# Patient Record
Sex: Male | Born: 1935 | Race: White | Hispanic: No | Marital: Married
Health system: Southern US, Community
[De-identification: ages and names within clinical notes are randomized; demographics above are authoritative.]

---

## 1998-10-09 ENCOUNTER — Encounter: Payer: Self-pay | Admitting: Emergency Medicine

## 1998-10-09 ENCOUNTER — Emergency Department (HOSPITAL_COMMUNITY): Admission: EM | Admit: 1998-10-09 | Discharge: 1998-10-09 | Payer: Self-pay | Admitting: Emergency Medicine

## 1999-05-03 ENCOUNTER — Ambulatory Visit (HOSPITAL_COMMUNITY): Admission: RE | Admit: 1999-05-03 | Discharge: 1999-05-03 | Payer: Self-pay | Admitting: Urology

## 2002-06-10 ENCOUNTER — Encounter: Admission: RE | Admit: 2002-06-10 | Discharge: 2002-06-10 | Payer: Self-pay | Admitting: Family Medicine

## 2002-06-10 ENCOUNTER — Encounter: Payer: Self-pay | Admitting: Family Medicine

## 2002-06-12 ENCOUNTER — Encounter: Admission: RE | Admit: 2002-06-12 | Discharge: 2002-09-10 | Payer: Self-pay | Admitting: Family Medicine

## 2004-11-09 ENCOUNTER — Ambulatory Visit: Payer: Self-pay | Admitting: Family Medicine

## 2005-03-08 ENCOUNTER — Ambulatory Visit: Payer: Self-pay | Admitting: Family Medicine

## 2005-03-14 ENCOUNTER — Ambulatory Visit: Payer: Self-pay | Admitting: Family Medicine

## 2005-04-25 ENCOUNTER — Ambulatory Visit: Payer: Self-pay | Admitting: Internal Medicine

## 2005-07-06 ENCOUNTER — Ambulatory Visit: Payer: Self-pay | Admitting: Internal Medicine

## 2005-10-10 ENCOUNTER — Ambulatory Visit: Payer: Self-pay | Admitting: Internal Medicine

## 2006-06-27 ENCOUNTER — Ambulatory Visit: Payer: Self-pay | Admitting: Internal Medicine

## 2006-07-06 ENCOUNTER — Emergency Department (HOSPITAL_COMMUNITY): Admission: EM | Admit: 2006-07-06 | Discharge: 2006-07-06 | Payer: Self-pay | Admitting: Emergency Medicine

## 2006-07-09 ENCOUNTER — Ambulatory Visit: Payer: Self-pay | Admitting: Internal Medicine

## 2006-10-26 ENCOUNTER — Encounter: Payer: Self-pay | Admitting: *Deleted

## 2006-10-26 DIAGNOSIS — M65849 Other synovitis and tenosynovitis, unspecified hand: Secondary | ICD-10-CM

## 2006-10-26 DIAGNOSIS — Z9889 Other specified postprocedural states: Secondary | ICD-10-CM

## 2006-10-26 DIAGNOSIS — F528 Other sexual dysfunction not due to a substance or known physiological condition: Secondary | ICD-10-CM

## 2006-10-26 DIAGNOSIS — Z8679 Personal history of other diseases of the circulatory system: Secondary | ICD-10-CM | POA: Insufficient documentation

## 2006-10-26 DIAGNOSIS — I10 Essential (primary) hypertension: Secondary | ICD-10-CM | POA: Insufficient documentation

## 2006-10-26 DIAGNOSIS — M65839 Other synovitis and tenosynovitis, unspecified forearm: Secondary | ICD-10-CM

## 2006-10-26 DIAGNOSIS — E785 Hyperlipidemia, unspecified: Secondary | ICD-10-CM | POA: Insufficient documentation

## 2006-10-26 DIAGNOSIS — F068 Other specified mental disorders due to known physiological condition: Secondary | ICD-10-CM

## 2007-06-24 ENCOUNTER — Ambulatory Visit: Payer: Self-pay | Admitting: Internal Medicine

## 2007-07-29 ENCOUNTER — Telehealth (INDEPENDENT_AMBULATORY_CARE_PROVIDER_SITE_OTHER): Payer: Self-pay | Admitting: *Deleted

## 2007-08-08 ENCOUNTER — Ambulatory Visit: Payer: Self-pay | Admitting: Internal Medicine

## 2007-08-08 DIAGNOSIS — R609 Edema, unspecified: Secondary | ICD-10-CM

## 2007-09-09 ENCOUNTER — Ambulatory Visit: Payer: Self-pay | Admitting: Internal Medicine

## 2007-09-09 DIAGNOSIS — M545 Low back pain: Secondary | ICD-10-CM | POA: Insufficient documentation

## 2007-12-09 ENCOUNTER — Telehealth (INDEPENDENT_AMBULATORY_CARE_PROVIDER_SITE_OTHER): Payer: Self-pay | Admitting: *Deleted

## 2007-12-09 DIAGNOSIS — M79609 Pain in unspecified limb: Secondary | ICD-10-CM

## 2007-12-22 ENCOUNTER — Encounter: Payer: Self-pay | Admitting: Internal Medicine

## 2008-07-07 ENCOUNTER — Encounter: Admission: RE | Admit: 2008-07-07 | Discharge: 2008-07-07 | Payer: Self-pay | Admitting: Geriatric Medicine

## 2009-01-23 ENCOUNTER — Inpatient Hospital Stay (HOSPITAL_COMMUNITY): Admission: EM | Admit: 2009-01-23 | Discharge: 2009-01-24 | Payer: Self-pay | Admitting: Emergency Medicine

## 2009-03-05 ENCOUNTER — Inpatient Hospital Stay (HOSPITAL_COMMUNITY): Admission: EM | Admit: 2009-03-05 | Discharge: 2009-03-08 | Payer: Self-pay | Admitting: Emergency Medicine

## 2009-03-29 DEATH — deceased

## 2010-04-19 LAB — URINE MICROSCOPIC-ADD ON

## 2010-04-19 LAB — CBC
HCT: 27.2 % — ABNORMAL LOW (ref 39.0–52.0)
HCT: 33.8 % — ABNORMAL LOW (ref 39.0–52.0)
Hemoglobin: 10 g/dL — ABNORMAL LOW (ref 13.0–17.0)
Hemoglobin: 9.2 g/dL — ABNORMAL LOW (ref 13.0–17.0)
MCHC: 33.8 g/dL (ref 30.0–36.0)
MCV: 89.8 fL (ref 78.0–100.0)
Platelets: 156 10*3/uL (ref 150–400)
Platelets: 205 10*3/uL (ref 150–400)
RBC: 3.32 MIL/uL — ABNORMAL LOW (ref 4.22–5.81)
RDW: 15.1 % (ref 11.5–15.5)
WBC: 31.8 10*3/uL — ABNORMAL HIGH (ref 4.0–10.5)
WBC: 33.2 10*3/uL — ABNORMAL HIGH (ref 4.0–10.5)
WBC: 34.9 10*3/uL — ABNORMAL HIGH (ref 4.0–10.5)

## 2010-04-19 LAB — POCT I-STAT 3, ART BLOOD GAS (G3+)
Acid-Base Excess: 1 mmol/L (ref 0.0–2.0)
Bicarbonate: 25.9 mEq/L — ABNORMAL HIGH (ref 20.0–24.0)
TCO2: 27 mmol/L (ref 0–100)
pO2, Arterial: 189 mmHg — ABNORMAL HIGH (ref 80.0–100.0)

## 2010-04-19 LAB — COMPREHENSIVE METABOLIC PANEL
ALT: 34 U/L (ref 0–53)
AST: 40 U/L — ABNORMAL HIGH (ref 0–37)
Albumin: 1.7 g/dL — ABNORMAL LOW (ref 3.5–5.2)
Albumin: 2.3 g/dL — ABNORMAL LOW (ref 3.5–5.2)
Alkaline Phosphatase: 65 U/L (ref 39–117)
Alkaline Phosphatase: 68 U/L (ref 39–117)
BUN: 31 mg/dL — ABNORMAL HIGH (ref 6–23)
BUN: 46 mg/dL — ABNORMAL HIGH (ref 6–23)
Chloride: 116 mEq/L — ABNORMAL HIGH (ref 96–112)
Chloride: 126 mEq/L — ABNORMAL HIGH (ref 96–112)
Creatinine, Ser: 1.31 mg/dL (ref 0.4–1.5)
GFR calc Af Amer: 47 mL/min — ABNORMAL LOW (ref >60)
Glucose, Bld: 77 mg/dL (ref 70–99)
Potassium: 3.8 mEq/L (ref 3.5–5.1)
Potassium: 3.9 mEq/L (ref 3.5–5.1)
Sodium: 152 mEq/L — ABNORMAL HIGH (ref 135–145)
Total Bilirubin: 0.7 mg/dL (ref 0.3–1.2)
Total Bilirubin: 0.7 mg/dL (ref 0.3–1.2)
Total Protein: 7.7 g/dL (ref 6.0–8.3)

## 2010-04-19 LAB — BASIC METABOLIC PANEL
Calcium: 7.5 mg/dL — ABNORMAL LOW (ref 8.4–10.5)
Creatinine, Ser: 1.26 mg/dL (ref 0.4–1.5)
GFR calc Af Amer: >60 mL/min (ref >60)
GFR calc non Af Amer: 54 mL/min — ABNORMAL LOW (ref >60)
Sodium: 149 mEq/L — ABNORMAL HIGH (ref 135–145)

## 2010-04-19 LAB — CULTURE, BLOOD (ROUTINE X 2): Culture: NO GROWTH

## 2010-04-19 LAB — URINE CULTURE: Colony Count: NO GROWTH

## 2010-04-19 LAB — DIFFERENTIAL
Basophils Absolute: 0 10*3/uL (ref 0.0–0.1)
Basophils Relative: 0 % (ref 0–1)
Eosinophils Absolute: 0 10*3/uL (ref 0.0–0.7)
Eosinophils Absolute: 0 10*3/uL (ref 0.0–0.7)
Eosinophils Relative: 0 % (ref 0–5)
Lymphocytes Relative: 5 % — ABNORMAL LOW (ref 12–46)
Monocytes Absolute: 5.3 10*3/uL — ABNORMAL HIGH (ref 0.1–1.0)
Monocytes Absolute: 6 10*3/uL — ABNORMAL HIGH (ref 0.1–1.0)
Neutro Abs: 23.9 10*3/uL — ABNORMAL HIGH (ref 1.7–7.7)
Neutrophils Relative %: 79 % — ABNORMAL HIGH (ref 43–77)

## 2010-04-19 LAB — URINALYSIS, ROUTINE W REFLEX MICROSCOPIC
Glucose, UA: NEGATIVE mg/dL
Ketones, ur: NEGATIVE mg/dL
pH: 5 (ref 5.0–8.0)

## 2010-04-19 LAB — MRSA PCR SCREENING: MRSA by PCR: NEGATIVE

## 2010-05-01 LAB — URINE CULTURE: Special Requests: NEGATIVE

## 2010-05-01 LAB — URINE MICROSCOPIC-ADD ON

## 2010-05-01 LAB — BASIC METABOLIC PANEL
CO2: 26 mEq/L (ref 19–32)
CO2: 27 mEq/L (ref 19–32)
Calcium: 7.8 mg/dL — ABNORMAL LOW (ref 8.4–10.5)
Calcium: 8.7 mg/dL (ref 8.4–10.5)
Creatinine, Ser: 1.15 mg/dL (ref 0.4–1.5)
GFR calc Af Amer: >60 mL/min (ref >60)
GFR calc Af Amer: >60 mL/min (ref >60)
GFR calc non Af Amer: >60 mL/min (ref >60)
GFR calc non Af Amer: >60 mL/min (ref >60)
Glucose, Bld: 112 mg/dL — ABNORMAL HIGH (ref 70–99)
Sodium: 142 mEq/L (ref 135–145)

## 2010-05-01 LAB — CULTURE, BLOOD (ROUTINE X 2)

## 2010-05-01 LAB — GLUCOSE, CAPILLARY: Glucose-Capillary: 102 mg/dL — ABNORMAL HIGH (ref 70–99)

## 2010-05-01 LAB — LIPID PANEL
HDL: 15 mg/dL — ABNORMAL LOW (ref >39)
Triglycerides: 94 mg/dL (ref <150)

## 2010-05-01 LAB — CBC
Hemoglobin: 11.3 g/dL — ABNORMAL LOW (ref 13.0–17.0)
MCHC: 33.8 g/dL (ref 30.0–36.0)
RBC: 3.74 MIL/uL — ABNORMAL LOW (ref 4.22–5.81)
RBC: 4.21 MIL/uL — ABNORMAL LOW (ref 4.22–5.81)
RDW: 15.1 % (ref 11.5–15.5)
WBC: 9.5 10*3/uL (ref 4.0–10.5)

## 2010-05-01 LAB — URINALYSIS, ROUTINE W REFLEX MICROSCOPIC
Glucose, UA: NEGATIVE mg/dL
Protein, ur: NEGATIVE mg/dL
Specific Gravity, Urine: 1.023 (ref 1.005–1.030)
pH: 6 (ref 5.0–8.0)

## 2010-05-01 LAB — CARDIAC PANEL(CRET KIN+CKTOT+MB+TROPI)
Relative Index: INVALID (ref 0.0–2.5)
Total CK: 61 U/L (ref 7–232)

## 2010-05-01 LAB — DIFFERENTIAL
Basophils Absolute: 0 10*3/uL (ref 0.0–0.1)
Basophils Relative: 1 % (ref 0–1)
Eosinophils Absolute: 0 10*3/uL (ref 0.0–0.7)
Eosinophils Relative: 1 % (ref 0–5)
Lymphocytes Relative: 13 % (ref 12–46)
Lymphocytes Relative: 24 % (ref 12–46)
Monocytes Relative: 29 % — ABNORMAL HIGH (ref 3–12)
Monocytes Relative: 32 % — ABNORMAL HIGH (ref 3–12)
Neutro Abs: 4 10*3/uL (ref 1.7–7.7)
Neutrophils Relative %: 58 % (ref 43–77)

## 2010-05-01 LAB — PROTIME-INR
INR: 1.22 (ref 0.00–1.49)
Prothrombin Time: 15.3 seconds — ABNORMAL HIGH (ref 11.6–15.2)

## 2010-05-01 LAB — TSH: TSH: 1.894 u[IU]/mL (ref 0.350–4.500)

## 2010-05-01 LAB — POCT CARDIAC MARKERS: Myoglobin, poc: 199 ng/mL (ref 12–200)

## 2010-05-01 LAB — CK TOTAL AND CKMB (NOT AT ARMC)
Relative Index: INVALID (ref 0.0–2.5)
Total CK: 77 U/L (ref 7–232)

## 2010-06-13 NOTE — Assessment & Plan Note (Signed)
 [facility]                                 ON-CALL NOTE   NAME:[name], Vincent ESCAMILLA                      MRN:          [id number]  DATE:07/06/2006                            DOB:          November 10, 1921    Phone number is [phone].  Caller is [employer].  Regular doctor is Dr.  Debby Bud.  Tobi Bastos, the [facility] at [facility] took the initial message,  and I followed through, and I spoke to [place].  Patient states that  Meir has severe dizziness.  Yesterday he was somewhat dizzy, he fell,  he hurt his shoulder.  She does not think he bumped his head, but she is  not sure.  She gave him some of her vertigo pills, which were meclizine.  They did not help at all.  He denies numbness, headache, slurred speech,  or weakness on 1 side, but he is getting more and more dizzy, and she is  worried.  I told her to go ahead and take him to the emergency room for  evaluation.  He may or may not need an imaging study of the head to find  out what is going on.  This could possibly still be vertigo, but I would  expect it to improve a bit with the meclizine.  She is taking him to the  emergency room now.     Marne A. Tower, MD  Electronically Signed    MAT/MedQ  DD: 07/06/2006  DT: 07/06/2006  Job #: [id number]   cc:   Rosalyn Gess. Norins, MD

## 2010-11-16 LAB — DIFFERENTIAL
Basophils Absolute: 0
Basophils Relative: 0
Eosinophils Relative: 0
Lymphocytes Relative: 31
Lymphs Abs: 2.2
Neutro Abs: 3.2
Neutrophils Relative %: 44
Promyelocytes Absolute: 0
nRBC: 0

## 2010-11-16 LAB — I-STAT 8, (EC8 V) (CONVERTED LAB)
Acid-Base Excess: 1
Glucose, Bld: 91
HCT: 43
Hemoglobin: 14.6
Operator id: 291361
Potassium: 4.4
Sodium: 141
TCO2: 30

## 2010-11-16 LAB — URINALYSIS, ROUTINE W REFLEX MICROSCOPIC
Bilirubin Urine: NEGATIVE
Glucose, UA: NEGATIVE
Hgb urine dipstick: NEGATIVE
Ketones, ur: NEGATIVE
Protein, ur: 30 — AB
pH: 6

## 2010-11-16 LAB — POCT CARDIAC MARKERS: CKMB, poc: 1.1

## 2010-11-16 LAB — CBC
MCHC: 34.3
RBC: 4.38
WBC: 7.2

## 2011-10-26 ENCOUNTER — Other Ambulatory Visit: Payer: Self-pay | Admitting: *Deleted

## 2011-11-22 IMAGING — CT CT HEAD W/O CM
1 of 2 series · 15 of 30 positions shown, 19 images · non-contrast
Comparison: 07/06/2006.

CLINICAL DATA: Altered level of consciousness.  Fever.

CT HEAD WITHOUT CONTRAST
TECHNIQUE: Contiguous axial images were obtained from the base of
the skull through the vertex without contrast.

[Series 3: headseq 2.4 h60s · axial · 0.43mm/px · z∈[+1178,+1306]mm · 15 of 60 slices shown, 19 images]
[im 4/60  brain]
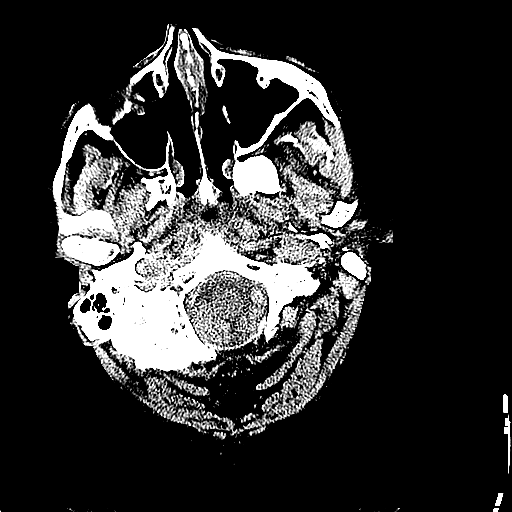
[im 4/60  bone]
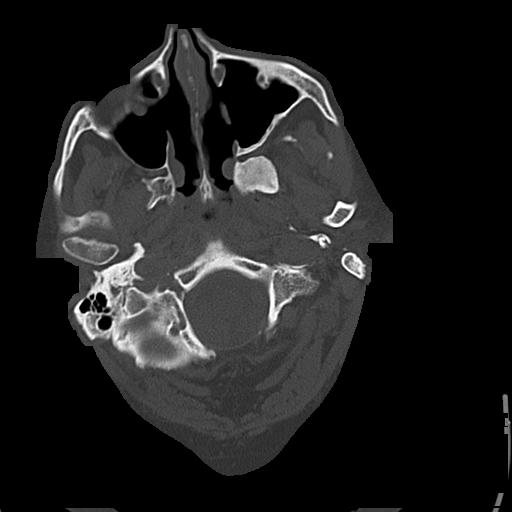
[im 7/60  brain]
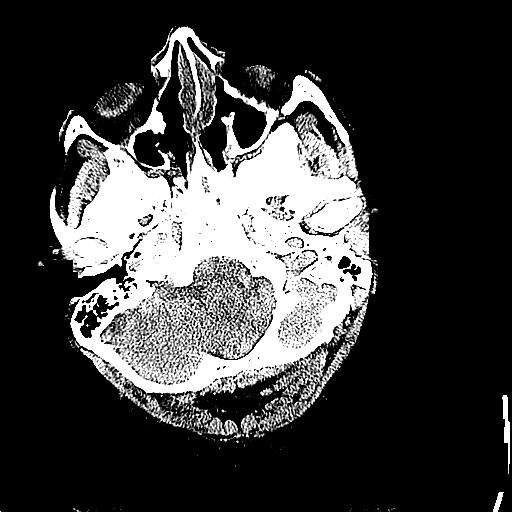
[im 13/60  brain]
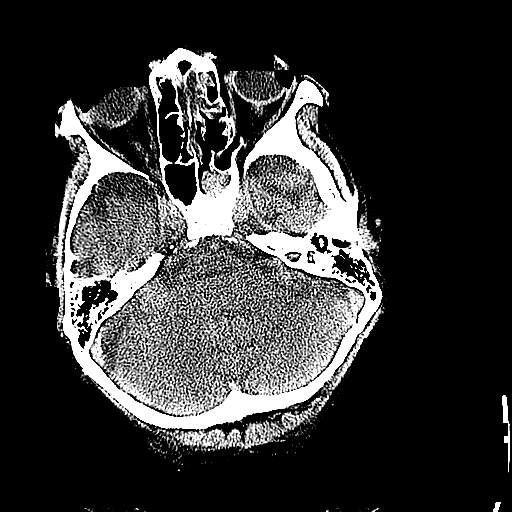
[im 16/60  brain]
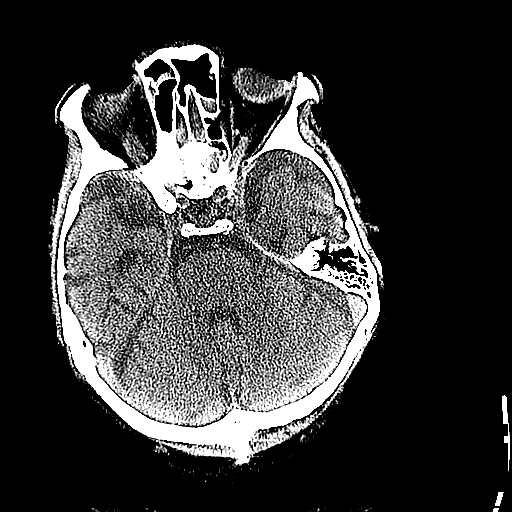
[im 19/60  brain]
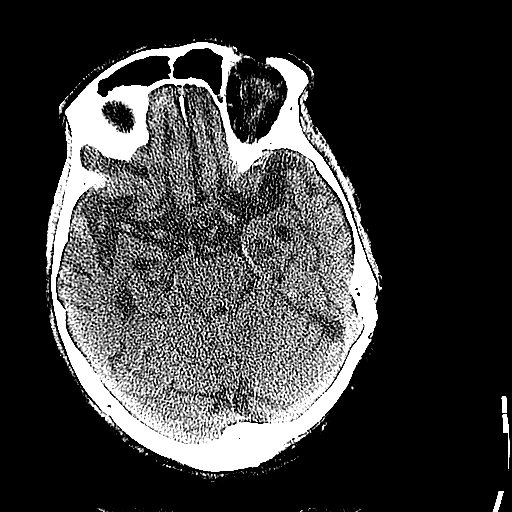
[im 19/60  bone]
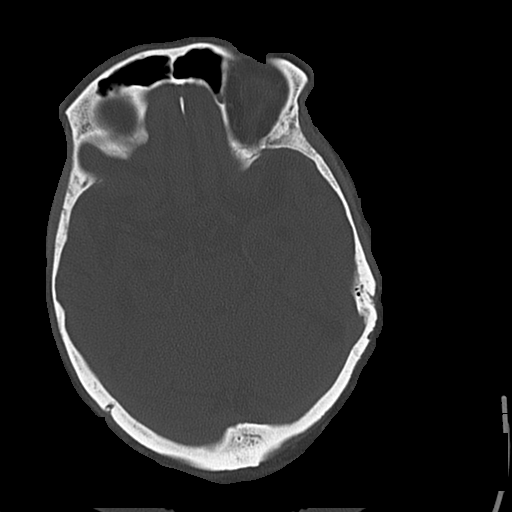
[im 22/60  brain]
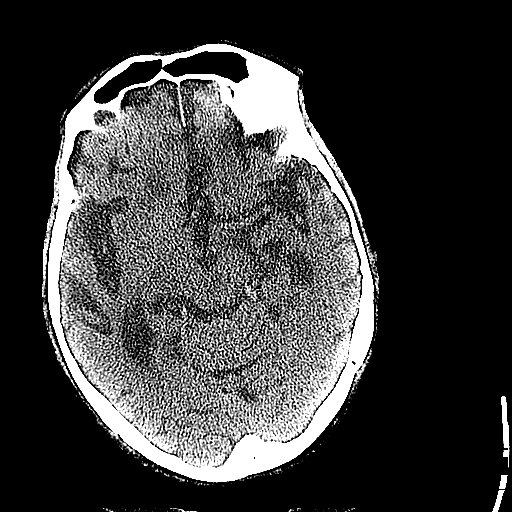
[im 25/60  brain]
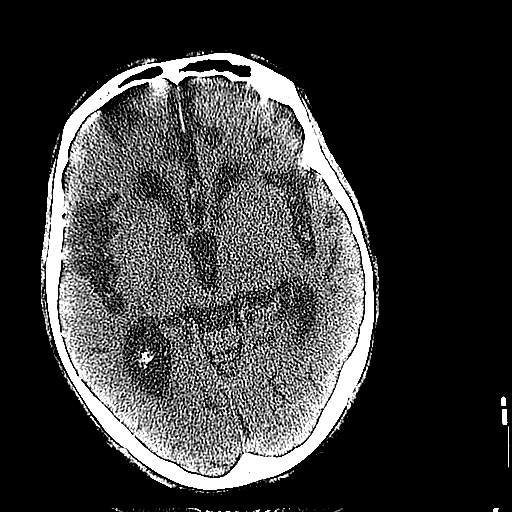
[im 32/60  brain]
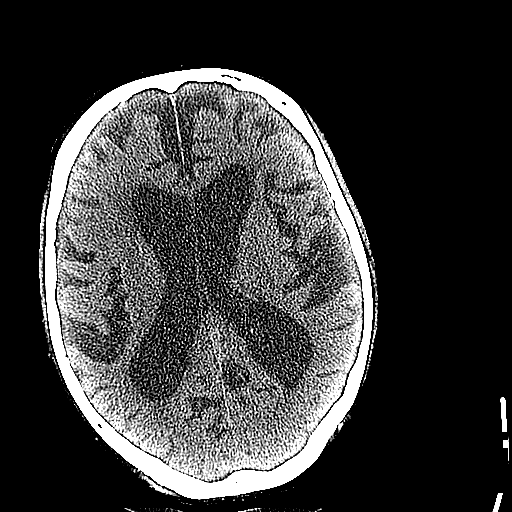
[im 35/60  brain]
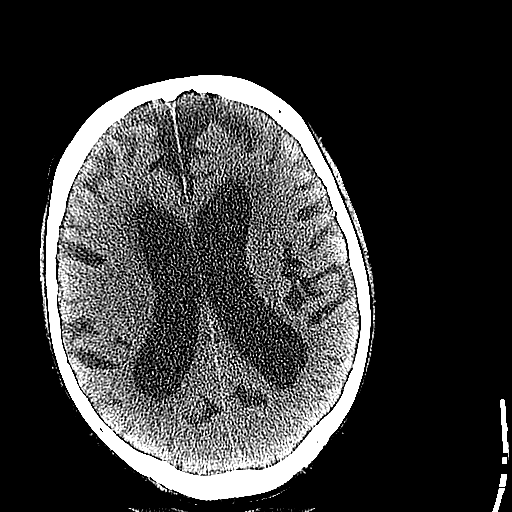
[im 35/60  bone]
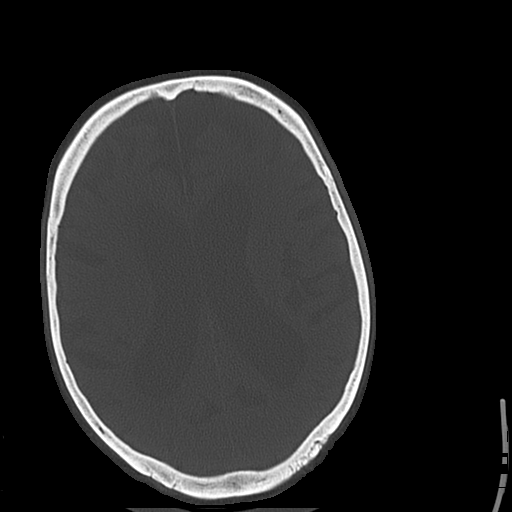
[im 38/60  brain]
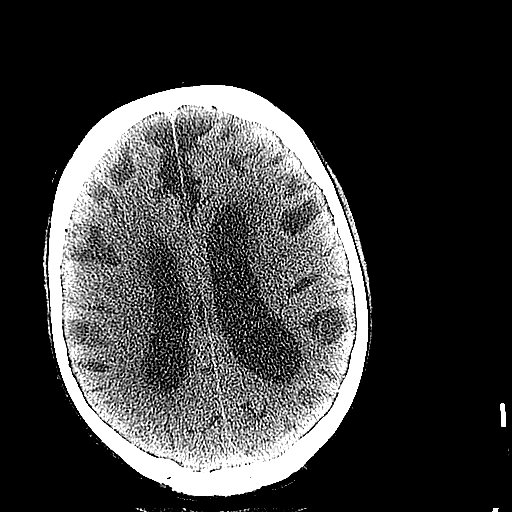
[im 41/60  brain]
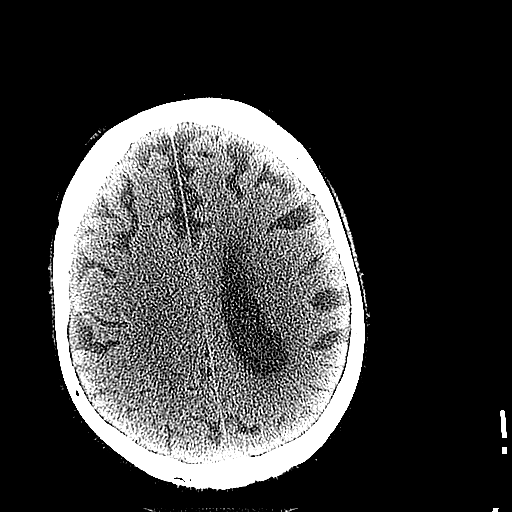
[im 44/60  brain]
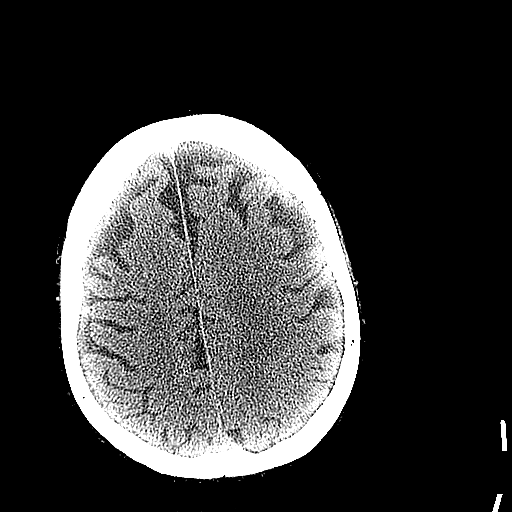
[im 50/60  brain]
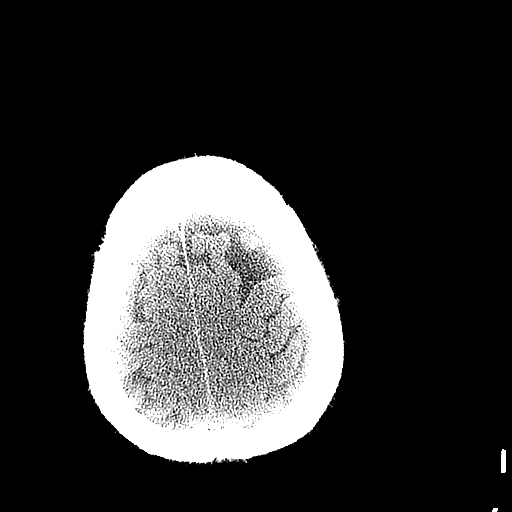
[im 50/60  bone]
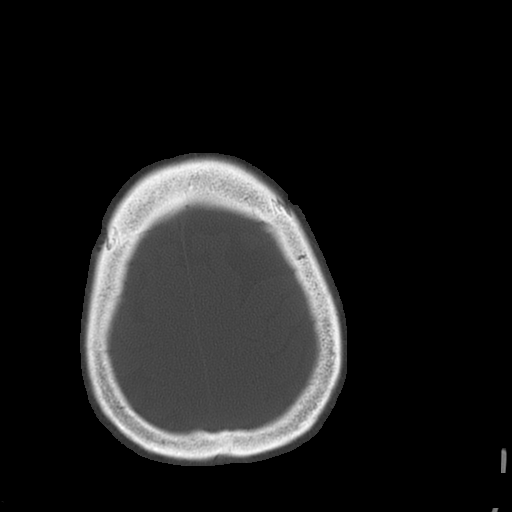
[im 53/60  brain]
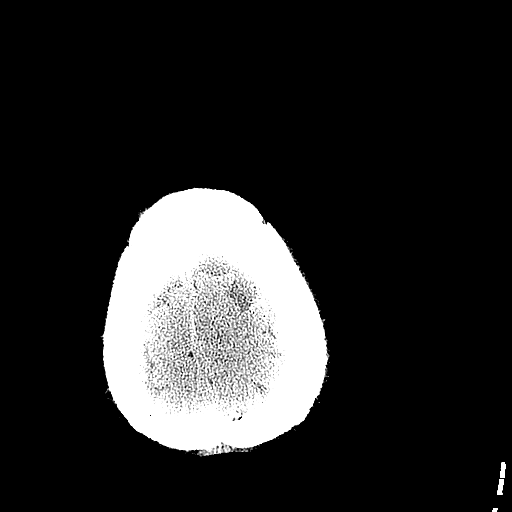
[im 56/60  brain]
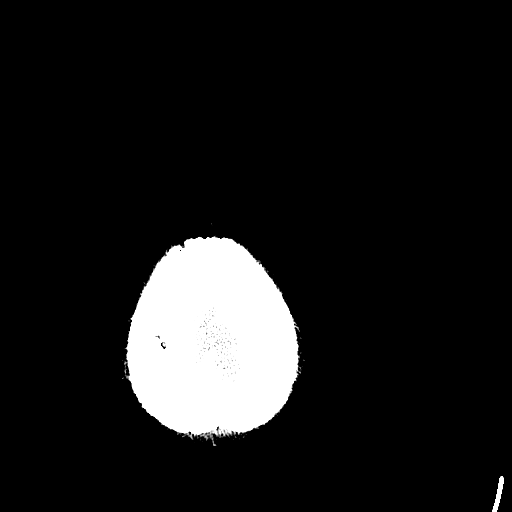

[15 of 30 positions shown; findings below may reference images not displayed]

FINDINGS: No acute intracranial abnormalities are present.  No
acute infarct, hemorrhage, mass, hydrocephalus, or significant
extra-axial fluid collection is present.  Atrophy and moderate
white matter changes are stable.  The patient is status post
bilateral maxillary sinus surgery.  There is wall thickening the
posterior left maxillary sinus, stable from prior exam.  Chronic
opacification of the anterior left ethmoid air cells and left
sphenoid sinus is stable.  There is chronic opacification of an
anterior right ethmoid air cell.  The mastoid air cells are clear.
Atherosclerotic calcifications are noted in the cavernous carotid
arteries.
IMPRESSION: 1.  No acute intracranial abnormality or significant interval
change.
2.  Stable atrophy white matter disease.
3.  Stable chronic sinus disease.  Prior sinus surgery is noted.
